# Patient Record
Sex: Male | Born: 1990 | Race: Black or African American | Hispanic: No | Marital: Single | Smoking: Never smoker
Health system: Southern US, Community
[De-identification: ages and names within clinical notes are randomized; demographics above are authoritative.]

## PROBLEM LIST (undated history)

## (undated) DIAGNOSIS — J302 Other seasonal allergic rhinitis: Secondary | ICD-10-CM

---
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
## 2008-08-13 ENCOUNTER — Emergency Department (HOSPITAL_COMMUNITY): Admission: EM | Admit: 2008-08-13 | Discharge: 2008-08-13 | Payer: Self-pay | Admitting: Emergency Medicine

## 2011-07-08 ENCOUNTER — Emergency Department (HOSPITAL_COMMUNITY)
Admission: EM | Admit: 2011-07-08 | Discharge: 2011-07-08 | Disposition: A | Discharge status: Home / Self Care | Payer: Self-pay | Source: Home / Self Care | Attending: Family Medicine | Admitting: Family Medicine

## 2011-07-08 ENCOUNTER — Encounter (HOSPITAL_COMMUNITY): Payer: Self-pay | Admitting: Emergency Medicine

## 2011-07-08 DIAGNOSIS — L02419 Cutaneous abscess of limb, unspecified: Secondary | ICD-10-CM

## 2011-07-08 DIAGNOSIS — J329 Chronic sinusitis, unspecified: Secondary | ICD-10-CM

## 2011-07-08 MED ORDER — FEXOFENADINE-PSEUDOEPHED ER 60-120 MG PO TB12: 1.0000 | ORAL_TABLET | Freq: Two times a day (BID) | ORAL | Status: AC

## 2011-07-08 MED ORDER — HYDROCODONE-ACETAMINOPHEN 5-500 MG PO TABS: 1.0000 | ORAL_TABLET | Freq: Three times a day (TID) | ORAL | Status: AC | PRN

## 2011-07-08 MED ORDER — IBUPROFEN 600 MG PO TABS: 600.0000 mg | ORAL_TABLET | Freq: Four times a day (QID) | ORAL | Status: AC | PRN

## 2011-07-08 MED ORDER — DOXYCYCLINE HYCLATE 100 MG PO CAPS: 100.0000 mg | ORAL_CAPSULE | Freq: Two times a day (BID) | ORAL | Status: DC

## 2011-07-08 NOTE — ED Provider Notes (Signed)
History     CSN: [id number]  Arrival date & time 07/08/11  0865   First MD Initiated Contact with Patient 07/08/11 1729      Chief Complaint  Patient presents with  . Insect Bite    (Consider location/radiation/quality/duration/timing/severity/associated sxs/prior treatment) HPI Comments: 21 y/o male with no significant PMH. Here c/o skin swelling tenderness in right calf for about 1 week. No spontaneous drainage. "Thinks he was bitten by a spider". Denies fever although has felt sick and weak for last 5 days and has had nasal congestion, sinus pressure and rhinorrhea for about 2 weeks. He works loading and unloading trucks.     History reviewed. No pertinent past medical history.  History reviewed. No pertinent past surgical history.  No family history on file.  History  Substance Use Topics  . Smoking status: Not on file  . Smokeless tobacco: Not on file  . Alcohol Use: Yes      Review of Systems  Constitutional: Positive for chills, appetite change and fatigue. Negative for fever and diaphoresis.  HENT: Positive for congestion, rhinorrhea and sinus pressure. Negative for ear pain, sore throat and neck stiffness.   Respiratory: Negative for cough.   Cardiovascular: Negative for chest pain.  Gastrointestinal: Negative for nausea, vomiting and abdominal pain.  Skin:       Right calf area of redness tenderness and swelling.    Allergies  Review of patient's allergies indicates no known allergies.  Home Medications   Current Outpatient Rx  Name Route Sig Dispense Refill  . DOXYCYCLINE HYCLATE 100 MG PO CAPS Oral Take 1 capsule (100 mg total) by mouth 2 (two) times daily. 20 capsule 0  . FEXOFENADINE-PSEUDOEPHED ER 60-120 MG PO TB12 Oral Take 1 tablet by mouth every 12 (twelve) hours. 30 tablet 0  . HYDROCODONE-ACETAMINOPHEN 5-500 MG PO TABS Oral Take 1-2 tablets by mouth every 8 (eight) hours as needed for pain. 6 tablet 0  . IBUPROFEN 600 MG PO TABS Oral Take 1  tablet (600 mg total) by mouth every 6 (six) hours as needed for pain. 30 tablet 0    BP 125/106  Pulse 78  Temp(Src) 99 F (37.2 C) (Oral)  Resp 16  SpO2 100%  Physical Exam  Nursing note and vitals reviewed. Constitutional: He is oriented to person, place, and time. He appears well-developed and well-nourished. No distress.  HENT:  Head: Normocephalic.  Mouth/Throat: Oropharynx is clear and moist. No oropharyngeal exudate.       Nasal Congestion with erythema and swelling of nasal turbinates, clear rhinorrhea. Reported maxillary sinus tenderness to palpation. No pharyngeal erythema or exudates. No uvula deviation. No trismus. TM's normal.  Eyes: Conjunctivae and EOM are normal. Pupils are equal, round, and reactive to light. No scleral icterus.  Neck: Normal range of motion. Neck supple.  Cardiovascular: Normal rate, regular rhythm and normal heart sounds.   No murmur heard. Pulmonary/Chest: Effort normal and breath sounds normal. No respiratory distress. He has no wheezes. He has no rales. He exhibits no tenderness.  Lymphadenopathy:    He has no cervical adenopathy.  Neurological: He is alert and oriented to person, place, and time.  Skin:       About 4x4 cm area of erythema, induration with central fluctuation and central small pustule consistent with an abscess in right calf. No significant associated cellulitis or striking erythema.     ED Course  INCISION AND DRAINAGE Performed by: Sharin Grave Authorized by: Sharin Grave Consent: Verbal consent obtained.  Risks and benefits: risks, benefits and alternatives were discussed Consent given by: patient Patient understanding: patient states understanding of the procedure being performed Type: abscess Body area: lower extremity Location details: right leg Anesthesia: local infiltration Local anesthetic: lidocaine 1% with epinephrine Anesthetic total: 3 ml Scalpel size: 11 Incision type: single  straight Complexity: simple Drainage: purulent Drainage amount: moderate Wound treatment: wound left open Packing material: 1/4 in iodoform gauze Patient tolerance: Patient tolerated the procedure well with no immediate complications.   (including critical care time)   Labs Reviewed  CULTURE, ROUTINE-ABSCESS   No results found.   1. Abscess of calf   2. Sinusitis       MDM  Right calf abscess s/p I&D. Also here with sinusitis symptoms. Started on doxycycline, allegra and wound care instructions. Asked to return in 24-48 hours for packing removal and wound recheck. Wound culture pending.        Sharin Grave, MD 07/09/11 1047

## 2011-07-08 NOTE — ED Notes (Signed)
Pt. Stated, He's been sick just feeling weak every morning since last Thursday.

## 2011-07-10 ENCOUNTER — Emergency Department (INDEPENDENT_AMBULATORY_CARE_PROVIDER_SITE_OTHER)
Admission: EM | Admit: 2011-07-10 | Discharge: 2011-07-10 | Disposition: A | Discharge status: Home / Self Care | Payer: 59 | Source: Home / Self Care | Attending: Family Medicine | Admitting: Family Medicine

## 2011-07-10 ENCOUNTER — Encounter (HOSPITAL_COMMUNITY): Payer: Self-pay | Admitting: *Deleted

## 2011-07-10 DIAGNOSIS — Z5189 Encounter for other specified aftercare: Secondary | ICD-10-CM

## 2011-07-10 MED ORDER — CEFTRIAXONE SODIUM 1 G IJ SOLR
INTRAMUSCULAR | Status: AC
  Filled 2011-07-10: qty 10

## 2011-07-10 MED ORDER — CEFTRIAXONE SODIUM 1 G IJ SOLR
1.0000 g | Freq: Once | INTRAMUSCULAR | Status: AC
  Administered 2011-07-10: 1 g via INTRAMUSCULAR

## 2011-07-10 NOTE — ED Provider Notes (Signed)
History     CSN: [id number]  Arrival date & time 07/10/11  1408   First MD Initiated Contact with Patient 07/10/11 1539      Chief Complaint  Patient presents with  . Wound Check    (Consider location/radiation/quality/duration/timing/severity/associated sxs/prior treatment) HPI Comments: 21 year old nondiabetic male comes for wound check after I&D 48-hour ago of a right  a calf abscess. Patient states taking doxycycline as instructed as well as pain medications. Has not removed dressing and packing is still in place. Reports his pain is the same as 2 days ago. Has not return to work yet. No drainage or bleeding through dressing, no increased redness or swelling. Reports nasal congestion and sinus pressure are much improved. Denies general malaise fever or chills. Appetite is at base line. Requesting stronger pain medication and work note. 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    History reviewed. No pertinent past medical history.  History reviewed. No pertinent past surgical history.  No family history on file.  History  Substance Use Topics  . Smoking status: Not on file  . Smokeless tobacco: Not on file  . Alcohol Use: Yes      Review of Systems  Constitutional: Negative for fever and chills.  HENT:       As per HPI  Musculoskeletal: Negative for arthralgias.  Skin:       As per HPI    Allergies  Review of patient's allergies indicates no known allergies.  Home Medications   Current Outpatient Rx  Name Route Sig Dispense Refill  . DOXYCYCLINE HYCLATE 100 MG PO CAPS Oral Take 1 capsule (100 mg total) by mouth 2 (two) times daily. 20 capsule 0  . FEXOFENADINE-PSEUDOEPHED ER 60-120 MG PO TB12 Oral Take 1 tablet by mouth every 12 (twelve) hours. 30 tablet 0  . HYDROCODONE-ACETAMINOPHEN 5-500 MG PO TABS Oral Take 1-2 tablets by mouth every 8 (eight) hours as needed for  pain. 6 tablet 0  . IBUPROFEN 600 MG PO TABS Oral Take 1 tablet (600 mg total) by mouth every 6 (six) hours as needed for pain. 30 tablet 0    BP 133/85  Pulse 70  Temp(Src) 97.9 F (36.6 C) (Oral)  Resp 16  SpO2 100%  Physical Exam  Nursing note and vitals reviewed. Constitutional: He is oriented to person, place, and time. He appears well-developed and well-nourished. No distress.  Neurological: He is alert and oriented to person, place, and time.  Skin:       Packing removed embedded in blood and pus. Wound looks clean with no persistent bleeding or drainage drainage. Minimal skin induration around. No erythema or fluctuation. Reported tenderness to palpation improved after packing was removed.    ED Course  Procedures (including critical care time)  Labs Reviewed - No data to display No results found.   1. Wound check, abscess       MDM  Impress disproportionate reported pain. Clinically improved infection with no signs of progressing cellulitis. Culture growing staph sensitivity still pending. Decided to administer rocephin 1g IM and patient asked to continue taking doxycycline to complete. Return in 48 hours or earlier if fever or worsening symptoms. Work note for 3 more days provided.        Sharin Grave, MD 07/11/11 1132

## 2011-07-10 NOTE — ED Notes (Signed)
Pt is here for follow up I&D of right calf 2 days ago.  Pt states he has been taking meds as prescribed, but says pain medication (hydrocodone) is not strong enough.

## 2011-07-10 NOTE — Discharge Instructions (Signed)
Continue to take the antibiotic twice a day as previously instructed. Removed dressing later today, and clean with soap and water and let warm water run in the shower. Dry well before covering after showering. Can put an antibiotic ointment like over-the-counter Neosporin before covering with dressing when going outside of the house. Return in 48-hour for wound recheck, return earlier if fever, chills, increased pain, swelling or drainage despite following treatment.

## 2011-07-11 LAB — CULTURE, ROUTINE-ABSCESS

## 2011-07-11 NOTE — ED Notes (Signed)
Abscess culture R leg: few Staph Aureus. Pt. adequately treated with Doxycycline. Vassie Moselle 07/11/2011

## 2011-07-12 ENCOUNTER — Emergency Department (INDEPENDENT_AMBULATORY_CARE_PROVIDER_SITE_OTHER)
Admission: EM | Admit: 2011-07-12 | Discharge: 2011-07-12 | Disposition: A | Discharge status: Home / Self Care | Payer: 59 | Source: Home / Self Care

## 2011-07-12 ENCOUNTER — Encounter (HOSPITAL_COMMUNITY): Payer: Self-pay | Admitting: *Deleted

## 2011-07-12 DIAGNOSIS — L03119 Cellulitis of unspecified part of limb: Secondary | ICD-10-CM

## 2011-07-12 DIAGNOSIS — L02419 Cutaneous abscess of limb, unspecified: Secondary | ICD-10-CM

## 2011-07-12 HISTORY — DX: Other seasonal allergic rhinitis: J30.2

## 2011-07-12 MED ORDER — CLINDAMYCIN HCL 300 MG PO CAPS: 300.0000 mg | ORAL_CAPSULE | Freq: Three times a day (TID) | ORAL | Status: AC

## 2011-07-12 NOTE — ED Notes (Signed)
Pt here for recheck abscess right lower leg taking antibiotic as directed - continues to drain -  took pain medication last night

## 2011-07-12 NOTE — ED Provider Notes (Signed)
History     CSN: [id number]  Arrival date & time 07/12/11  1208   None     Chief Complaint  Patient presents with  . Wound Check    (Consider location/radiation/quality/duration/timing/severity/associated sxs/prior treatment) HPI Comments: Patient presents today for recheck of an abscess on his right calf. He states that he is taking the doxycycline as prescribed. He continues to have drainage and pain, though he admits that he has mild improvement in his pain. His girlfriend reports that she is only noticing minimal improvement in swelling, but otherwise no change in appearance. He works at [employer] and states that he is required to stand and walk during his shift. He is concerned that he is not able her ready yet to return to work. No fever or chills.    Past Medical History  Diagnosis Date  . Seasonal allergies     History reviewed. No pertinent past surgical history.  History reviewed. No pertinent family history.  History  Substance Use Topics  . Smoking status: Not on file  . Smokeless tobacco: Not on file  . Alcohol Use: Yes      Review of Systems  Constitutional: Negative for fever and chills.  Musculoskeletal: Negative for joint swelling.  Skin: Negative for color change.    Allergies  Review of patient's allergies indicates no known allergies.  Home Medications   Current Outpatient Rx  Name Route Sig Dispense Refill  . FEXOFENADINE-PSEUDOEPHED ER 60-120 MG PO TB12 Oral Take 1 tablet by mouth every 12 (twelve) hours. 30 tablet 0  . HYDROCODONE-ACETAMINOPHEN 5-500 MG PO TABS Oral Take 1-2 tablets by mouth every 8 (eight) hours as needed for pain. 6 tablet 0  . IBUPROFEN 600 MG PO TABS Oral Take 1 tablet (600 mg total) by mouth every 6 (six) hours as needed for pain. 30 tablet 0  . CLINDAMYCIN HCL 300 MG PO CAPS Oral Take 1 capsule (300 mg total) by mouth 3 (three) times daily. 21 capsule 0    BP 121/74  Pulse 56  Temp(Src) 98 F (36.7  C) (Oral)  Resp 16  Physical Exam  Nursing note and vitals reviewed. Constitutional: He appears well-developed and well-nourished. No distress.  HENT:  Head: Normocephalic and atraumatic.  Musculoskeletal:       Legs: Skin: Skin is warm and dry.       Rt calf incision site with mild amount of purulent drainage at opening. Induration and TTP noted extending 2 cm superiorly from incision site and pus expressed with palpation. Soft, supple and nontender inferior to incision site.   Psychiatric: He has a normal mood and affect.    ED Course  Procedures (including critical care time)  Labs Reviewed - No data to display No results found.   1. Abscess of calf       MDM  Pt and his girlfriend report mild improvement in swelling and pain only since I&D and antibiotic treatment begun on 07-08-11. Pus expressed today. Culture reviewed. Antibiotic changed. To return again in 2 days for recheck.  FMLA paperwork completed.         Melody Comas, [place] 07/12/11 1427

## 2011-07-12 NOTE — ED Notes (Signed)
Dressing applied to right lower leg

## 2011-07-12 NOTE — ED Provider Notes (Signed)
Medical screening examination/treatment/procedure(s) were performed by non-physician practitioner and as supervising physician I was immediately available for consultation/collaboration.  Raynald Blend, MD 07/12/11 1451

## 2011-07-12 NOTE — Discharge Instructions (Signed)
Warm compresses to leg 3-4 times a day for 15-20 minutes. Stop Doxycycline, your current antibiotic, and begin the new antibiotic prescription today. If you develop diarrhea with the new antibiotic stop taking and return as soon as possible. Return in 2 days for recheck of abscess.

## 2011-07-14 ENCOUNTER — Encounter (HOSPITAL_COMMUNITY): Payer: Self-pay

## 2011-07-14 ENCOUNTER — Emergency Department (INDEPENDENT_AMBULATORY_CARE_PROVIDER_SITE_OTHER)
Admission: EM | Admit: 2011-07-14 | Discharge: 2011-07-14 | Disposition: A | Discharge status: Home / Self Care | Payer: 59 | Source: Home / Self Care | Attending: Emergency Medicine | Admitting: Emergency Medicine

## 2011-07-14 DIAGNOSIS — L0291 Cutaneous abscess, unspecified: Secondary | ICD-10-CM

## 2011-07-14 DIAGNOSIS — L0390 Cellulitis, unspecified: Secondary | ICD-10-CM

## 2011-07-14 NOTE — ED Notes (Signed)
Here for leg abscess recheck; states feels better, but continues to have swelling

## 2011-07-14 NOTE — ED Provider Notes (Signed)
Chief Complaint  Patient presents with  . Wound Check    History of Present Illness:   Mr. Steven Pratt is a 21 year old male who has been here twice in the past with an abscess on his left posterior calf. This was incised and drained. The packing has been removed. It still draining a little bit and there still some swelling and induration and slight soreness but is getting better. He doesn't have any fever or chills. He was switched from doxycycline to clindamycin.  Review of Systems:  Other than noted above, the patient denies any of the following symptoms: Systemic:  No fever, chills, sweats, weight loss, or fatigue. ENT:  No nasal congestion, rhinorrhea, sore throat, swelling of lips, tongue or throat. Resp:  No cough, wheezing, or shortness of breath. Skin:  No rash, itching, nodules, or suspicious lesions.  PMFSH:  Past medical history, family history, social history, meds, and allergies were reviewed.  Physical Exam:   Vital signs:  BP 118/68  Pulse 74  Temp(Src) 97 F (36.1 C) (Oral)  Resp 16  SpO2 97% Gen:  Alert, oriented, in no distress. Skin:  He has an open abscess cavity but this appears clean and without any purulent drainage. There still a little induration slight soreness around the abscess cavity extending upward 2-3 cm toward the popliteal fossa. There is no induration below the lesion.  Assessment:   Diagnoses that have been ruled out:  None  Diagnoses that are still under consideration:  None  Final diagnoses:  Abscess    Plan:   1.  The following meds were prescribed:   New Prescriptions   No medications on file   2.  The patient was instructed in symptomatic care and handouts were given. 3.  The patient was told to return if becoming worse in any way, if no better in 3 or 4 days, and given some red flag symptoms that would indicate earlier return.     Reuben Likes, MD 07/14/11 [phone]

## 2011-07-14 NOTE — Discharge Instructions (Signed)
Abscess An abscess (boil or furuncle) is an infected area that contains a collection of pus.  SYMPTOMS Signs and symptoms of an abscess include pain, tenderness, redness, or hardness. You may feel a moveable soft area under your skin. An abscess can occur anywhere in the body.  TREATMENT  A surgical cut (incision) may be made over your abscess to drain the pus. Gauze may be packed into the space or a drain may be looped through the abscess cavity (pocket). This provides a drain that will allow the cavity to heal from the inside outwards. The abscess may be painful for a few days, but should feel much better if it was drained.  Your abscess, if seen early, may not have localized and may not have been drained. If not, another appointment may be required if it does not get better on its own or with medications. HOME CARE INSTRUCTIONS   Only take over-the-counter or prescription medicines for pain, discomfort, or fever as directed by your caregiver.   Take your antibiotics as directed if they were prescribed. Finish them even if you start to feel better.   Keep the skin and clothes clean around your abscess.   If the abscess was drained, you will need to use gauze dressing to collect any draining pus. Dressings will typically need to be changed 3 or more times a day.   The infection may spread by skin contact with others. Avoid skin contact as much as possible.   Practice good hygiene. This includes regular hand washing, cover any draining skin lesions, and do not share personal care items.   If you participate in sports, do not share athletic equipment, towels, whirlpools, or personal care items. Shower after every practice or tournament.   If a draining area cannot be adequately covered:   Do not participate in sports.   Children should not participate in day care until the wound has healed or drainage stops.   If your caregiver has given you a follow-up appointment, it is very important  to keep that appointment. Not keeping the appointment could result in a much worse infection, chronic or permanent injury, pain, and disability. If there is any problem keeping the appointment, you must call back to this facility for assistance.  SEEK MEDICAL CARE IF:   You develop increased pain, swelling, redness, drainage, or bleeding in the wound site.   You develop signs of generalized infection including muscle aches, chills, fever, or a general ill feeling.   You have an oral temperature above 102 F (38.9 C).  MAKE SURE YOU:   Understand these instructions.   Will watch your condition.   Will get help right away if you are not doing well or get worse.  Document Released: 01/22/2005 Document Revised: 04/03/2011 Document Reviewed: 11/16/2007 Exit[employer] Patient Information 2012 [employer].  You have had an abscess drained.  An abscess is a collection of pus caused by infection with skin bacteria such as Streptococcus or Staphylococcus.  Since this is and infection, you may be contagious.  For the first 2 days, leave the dressing in place and keep it clean and dry. This means you should not get it wet.  You will have to take a sponge bath rather than a shower.  If the abscess was packed, we may instruct you to come back in 2 to 3 days to have the packing removed.  If the abscess was not packed, you may remove the dressing yourself in 2 days and take   care of the wound yourself.  After the packing is out, change the dressing at least once a day.  You may bathe or shower once the packing has been removed.  Assemble all the dressing material before you change the dressing, wear gloves, dispose of the soiled dressing material well and wash your hands before and after changing the dressing.  Wash the area well with soap and water, taking care to remove all the dried blood and drainage.  Apply a thin layer of antibiotic ointment (Bacitracin or Polysporin) around the abscess cavity, then apply  a gauze dressing.  You may want to use a non-adherent dressing like Telfa.  Fasten this in place well with tape.  Continue to change the dressing until there is no further drainage.  Finish up the entire prescription of any antibiotics that you have been given.  Take infectious precautions since the bacteria that cause these abscesses may be contagious.  Wash hands frequently or use hand sanitizer, especially after touching the abscess area or changing dressings.  Do not allow anyone else to use your towel or washcloth and wash these items after each use until the abscess has healed.  You may want to use an antibacterial soap such as Dial or Safeguard or a prescription body wash like Hibiclens.  You also may want to consider spraying the tub or shower with a disinfectant such a Lysol until the abscess has healed.  Things that should prompt you to return to the office for a recheck include:  Fever over 100 degrees, increasing pain or drainage, failure of the abscess to heal after 10 days, or other skin lesions elsewhere.   

## 2011-07-14 NOTE — ED Notes (Signed)
Abscess culture R leg: few Staph. Aureus. Pt. adequately treated with Doxycycline.  Vassie Moselle 07/14/2011

## 2015-09-20 ENCOUNTER — Emergency Department (HOSPITAL_COMMUNITY): Payer: No Typology Code available for payment source

## 2015-09-20 ENCOUNTER — Encounter (HOSPITAL_COMMUNITY): Payer: Self-pay | Admitting: Emergency Medicine

## 2015-09-20 ENCOUNTER — Emergency Department (HOSPITAL_COMMUNITY)
Admission: EM | Admit: 2015-09-20 | Discharge: 2015-09-20 | Disposition: A | Discharge status: Home / Self Care | Payer: No Typology Code available for payment source | Attending: Emergency Medicine | Admitting: Emergency Medicine

## 2015-09-20 DIAGNOSIS — Z79899 Other long term (current) drug therapy: Secondary | ICD-10-CM | POA: Diagnosis not present

## 2015-09-20 DIAGNOSIS — S29019A Strain of muscle and tendon of unspecified wall of thorax, initial encounter: Secondary | ICD-10-CM

## 2015-09-20 DIAGNOSIS — Y939 Activity, unspecified: Secondary | ICD-10-CM | POA: Diagnosis not present

## 2015-09-20 DIAGNOSIS — S29012A Strain of muscle and tendon of back wall of thorax, initial encounter: Secondary | ICD-10-CM | POA: Insufficient documentation

## 2015-09-20 DIAGNOSIS — Y999 Unspecified external cause status: Secondary | ICD-10-CM | POA: Insufficient documentation

## 2015-09-20 DIAGNOSIS — S161XXA Strain of muscle, fascia and tendon at neck level, initial encounter: Secondary | ICD-10-CM | POA: Diagnosis not present

## 2015-09-20 DIAGNOSIS — Y92410 Unspecified street and highway as the place of occurrence of the external cause: Secondary | ICD-10-CM | POA: Insufficient documentation

## 2015-09-20 DIAGNOSIS — S199XXA Unspecified injury of neck, initial encounter: Secondary | ICD-10-CM | POA: Diagnosis present

## 2015-09-20 MED ORDER — IBUPROFEN 800 MG PO TABS: 800.0000 mg | ORAL_TABLET | Freq: Three times a day (TID) | ORAL | Status: AC

## 2015-09-20 MED ORDER — HYDROCODONE-ACETAMINOPHEN 5-325 MG PO TABS: 1.0000 | ORAL_TABLET | ORAL | Status: AC | PRN

## 2015-09-20 MED ORDER — IBUPROFEN 600 MG PO TABS: 600.0000 mg | ORAL_TABLET | Freq: Four times a day (QID) | ORAL | Status: AC | PRN

## 2015-09-20 MED ORDER — IBUPROFEN 200 MG PO TABS
600.0000 mg | ORAL_TABLET | Freq: Once | ORAL | Status: AC
  Administered 2015-09-20: 600 mg via ORAL
  Filled 2015-09-20: qty 3

## 2015-09-20 MED ORDER — DIAZEPAM 5 MG PO TABS: 5.0000 mg | ORAL_TABLET | Freq: Two times a day (BID) | ORAL | Status: AC
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
## 2015-09-20 NOTE — ED Notes (Signed)
Discharge instructions, follow up care, and prescriptions reviewed with patient. Patient verbalized understanding. 

## 2015-09-20 NOTE — Discharge Instructions (Signed)
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
## 2015-09-20 NOTE — ED Notes (Signed)
MD at bedside. 
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
## 2020-05-10 ENCOUNTER — Ambulatory Visit (HOSPITAL_COMMUNITY)
Admission: EM | Admit: 2020-05-10 | Discharge: 2020-05-10 | Disposition: A | Discharge status: Home / Self Care | Payer: 59 | Attending: Family Medicine | Admitting: Family Medicine

## 2020-05-10 ENCOUNTER — Encounter (HOSPITAL_COMMUNITY): Payer: Self-pay

## 2020-05-10 ENCOUNTER — Other Ambulatory Visit: Payer: Self-pay

## 2020-05-10 DIAGNOSIS — R300 Dysuria: Secondary | ICD-10-CM

## 2020-05-10 DIAGNOSIS — N4889 Other specified disorders of penis: Secondary | ICD-10-CM

## 2020-05-10 LAB — POCT URINALYSIS DIPSTICK, ED / UC
Bilirubin Urine: NEGATIVE
Glucose, UA: NEGATIVE mg/dL
Ketones, ur: NEGATIVE mg/dL
Nitrite: NEGATIVE
Protein, ur: NEGATIVE mg/dL
Specific Gravity, Urine: 1.015 (ref 1.005–1.030)
Urobilinogen, UA: 0.2 mg/dL (ref 0.0–1.0)
pH: 7 (ref 5.0–8.0)

## 2020-05-10 MED ORDER — DOXYCYCLINE HYCLATE 100 MG PO TABS: 100.0000 mg | ORAL_TABLET | Freq: Two times a day (BID) | ORAL | 0 refills | Status: AC

## 2020-05-10 MED ORDER — LIDOCAINE HCL (PF) 1 % IJ SOLN
INTRAMUSCULAR | Status: AC
  Filled 2020-05-10: qty 2

## 2020-05-10 MED ORDER — CEFTRIAXONE SODIUM 500 MG IJ SOLR
500.0000 mg | Freq: Once | INTRAMUSCULAR | Status: AC
  Administered 2020-05-10: 500 mg via INTRAMUSCULAR

## 2020-05-10 MED ORDER — CEFTRIAXONE SODIUM 500 MG IJ SOLR
INTRAMUSCULAR | Status: AC
  Filled 2020-05-10: qty 500

## 2020-05-10 NOTE — ED Triage Notes (Signed)
Pt presents for STD Testing after having a discomfort in his penis and difficulty urinating; pt states he had unprotected sex a week ago.

## 2020-05-10 NOTE — ED Provider Notes (Signed)
[facility]    CSN: [id number] Arrival date & time: 05/10/20  1208      History   Chief Complaint Chief Complaint  Patient presents with  . STD Testing    HPI Steven Pratt is a 30 y.o. male.   Here today with 4 days of dysuria and penile discomfort. Denies known penile discharge, rashes, hematuria, abdominal pain, fever. Unprotected sex with new partner last weekend prior to onset of sxs, otherwise believes maybe it could be related to poor water intake. Has not tried anything OTC for sxs.       Past Medical History:  Diagnosis Date  . Seasonal allergies     There are no problems to display for this patient.   History reviewed. No pertinent surgical history.     Home Medications    Prior to Admission medications   Medication Sig Start Date End Date Taking? Authorizing Provider  doxycycline (VIBRA-TABS) 100 MG tablet Take 1 tablet (100 mg total) by mouth 2 (two) times daily. 05/10/20  Yes [name], PA-C  diazepam (VALIUM) 5 MG tablet Take 1 tablet (5 mg total) by mouth 2 (two) times daily. 09/20/15   Jacalyn Lefevre, MD  EPINEPHrine (EPIPEN 2-PAK IJ) Inject 1 Dose as directed as needed.    [provider]  HYDROcodone-acetaminophen (NORCO/VICODIN) 5-325 MG tablet Take 1 tablet by mouth every 4 (four) hours as needed. 09/20/15   Jacalyn Lefevre, MD  ibuprofen (ADVIL,MOTRIN) 600 MG tablet Take 1 tablet (600 mg total) by mouth every 6 (six) hours as needed. 09/20/15   Jacalyn Lefevre, MD  ibuprofen (ADVIL,MOTRIN) 800 MG tablet Take 1 tablet (800 mg total) by mouth 3 (three) times daily. 09/20/15   Jacalyn Lefevre, MD  naproxen sodium (ANAPROX) 220 MG tablet Take 440 mg by mouth 2 (two) times daily as needed (pain).    [provider]    Family History Family History  Family history unknown: Yes    Social History Social History   Tobacco Use  . Smoking status: Never Smoker  Substance Use Topics  . Alcohol use: Yes  .  Drug use: No     Allergies   Bee venom   Review of Systems Review of Systems PER HPI   Physical Exam Triage Vital Signs ED Triage Vitals  Enc Vitals Group     BP 05/10/20 1249 (!) 143/95     Pulse Rate 05/10/20 1249 92     Resp 05/10/20 1249 18     Temp 05/10/20 1249 99.6 F (37.6 C)     Temp Source 05/10/20 1249 Oral     SpO2 05/10/20 1249 98 %     Weight --      Height --      Head Circumference --      Peak Flow --      Pain Score 05/10/20 1246 3     Pain Loc --      Pain Edu? --      Excl. in GC? --    No data found.  Updated Vital Signs BP (!) 143/95 (BP Location: Right Arm)   Pulse 92   Temp 99.6 F (37.6 C) (Oral)   Resp 18   SpO2 98%   Visual Acuity Right Eye Distance:   Left Eye Distance:   Bilateral Distance:    Right Eye Near:   Left Eye Near:    Bilateral Near:     Physical Exam Vitals and nursing note reviewed.  Constitutional:      Appearance: Normal appearance.  HENT:     Head: Atraumatic.  Eyes:     Extraocular Movements: Extraocular movements intact.     Conjunctiva/sclera: Conjunctivae normal.  Cardiovascular:     Rate and Rhythm: Normal rate and regular rhythm.  Pulmonary:     Effort: Pulmonary effort is normal.     Breath sounds: Normal breath sounds.  Abdominal:     General: Bowel sounds are normal. There is no distension.     Palpations: Abdomen is soft.     Tenderness: There is no abdominal tenderness. There is no right CVA tenderness, left CVA tenderness or guarding.  Genitourinary:    Penis: Normal.   Musculoskeletal:        General: Normal range of motion.     Cervical back: Normal range of motion and neck supple.  Skin:    General: Skin is warm and dry.  Neurological:     General: No focal deficit present.     Mental Status: He is oriented to person, place, and time.  Psychiatric:        Mood and Affect: Mood normal.        Thought Content: Thought content normal.        Judgment: Judgment normal.       UC Treatments / Results  Labs (all labs ordered are listed, but only abnormal results are displayed) Labs Reviewed  POCT URINALYSIS DIPSTICK, ED / UC - Abnormal; Notable for the following components:      Result Value   Hgb urine dipstick LARGE (*)    Leukocytes,Ua MODERATE (*)    All other components within normal limits  URINE CULTURE  CYTOLOGY, (ORAL, ANAL, URETHRAL) ANCILLARY ONLY    EKG   Radiology No results found.  Procedures Procedures (including critical care time)  Medications Ordered in UC Medications  cefTRIAXone (ROCEPHIN) injection 500 mg (has no administration in time range)    Initial Impression / Assessment and Plan / UC Course  I have reviewed the triage vital signs and the nursing notes.  Pertinent labs & imaging results that were available during my care of the patient were reviewed by me and considered in my medical decision making (see chart for details).     U/A with leukocytes and hgb, cytology and urine culture pending. Will start IM rocephin and doxycycline while awaiting results. Discussed abstinence until results return and safe sexual practices  Final Clinical Impressions(s) / UC Diagnoses   Final diagnoses:  Dysuria  Penile irritation   Discharge Instructions   None    ED Prescriptions    Medication Sig Dispense Auth. Provider   doxycycline (VIBRA-TABS) 100 MG tablet Take 1 tablet (100 mg total) by mouth 2 (two) times daily. 14 tablet [name], [place]     PDMP not reviewed this encounter.   [name], [place] 05/10/20 1327

## 2020-05-11 LAB — CYTOLOGY, (ORAL, ANAL, URETHRAL) ANCILLARY ONLY
Chlamydia: NEGATIVE
Comment: NEGATIVE
Comment: NEGATIVE
Comment: NORMAL
Neisseria Gonorrhea: POSITIVE — AB
Trichomonas: NEGATIVE

## 2020-05-12 LAB — URINE CULTURE: Culture: NO GROWTH
